# Patient Record
Sex: Male | Born: 1985 | Hispanic: Yes | Marital: Single
Health system: Southern US, Community
[De-identification: ages and names within clinical notes are randomized; demographics above are authoritative.]

## PROBLEM LIST (undated history)

## (undated) DIAGNOSIS — E119 Type 2 diabetes mellitus without complications: Secondary | ICD-10-CM

---

## 2006-06-08 ENCOUNTER — Emergency Department: Payer: Self-pay | Admitting: Emergency Medicine

## 2006-06-09 ENCOUNTER — Emergency Department: Payer: Self-pay | Admitting: Emergency Medicine

## 2007-01-08 ENCOUNTER — Emergency Department: Payer: Self-pay | Admitting: Emergency Medicine

## 2007-02-16 ENCOUNTER — Emergency Department: Payer: Self-pay | Admitting: Emergency Medicine

## 2007-05-29 ENCOUNTER — Emergency Department: Payer: Self-pay | Admitting: Emergency Medicine

## 2007-05-30 ENCOUNTER — Emergency Department: Payer: Self-pay | Admitting: Emergency Medicine

## 2008-03-24 ENCOUNTER — Emergency Department: Payer: Self-pay | Admitting: Emergency Medicine
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
## 2009-10-10 ENCOUNTER — Emergency Department: Payer: Self-pay | Admitting: Emergency Medicine

## 2018-04-21 ENCOUNTER — Emergency Department: Payer: Self-pay

## 2018-04-21 ENCOUNTER — Encounter: Payer: Self-pay | Admitting: Emergency Medicine

## 2018-04-21 ENCOUNTER — Emergency Department
Admission: EM | Admit: 2018-04-21 | Discharge: 2018-04-21 | Disposition: A | Discharge status: Home / Self Care | Payer: Self-pay | Attending: Emergency Medicine | Admitting: Emergency Medicine

## 2018-04-21 DIAGNOSIS — Y929 Unspecified place or not applicable: Secondary | ICD-10-CM | POA: Insufficient documentation

## 2018-04-21 DIAGNOSIS — R0789 Other chest pain: Secondary | ICD-10-CM | POA: Insufficient documentation

## 2018-04-21 DIAGNOSIS — Z008 Encounter for other general examination: Secondary | ICD-10-CM | POA: Insufficient documentation

## 2018-04-21 DIAGNOSIS — Y9389 Activity, other specified: Secondary | ICD-10-CM | POA: Insufficient documentation

## 2018-04-21 DIAGNOSIS — R0602 Shortness of breath: Secondary | ICD-10-CM | POA: Insufficient documentation

## 2018-04-21 DIAGNOSIS — E119 Type 2 diabetes mellitus without complications: Secondary | ICD-10-CM | POA: Insufficient documentation

## 2018-04-21 DIAGNOSIS — Y999 Unspecified external cause status: Secondary | ICD-10-CM | POA: Insufficient documentation

## 2018-04-21 HISTORY — DX: Type 2 diabetes mellitus without complications: E11.9

## 2018-04-21 LAB — GLUCOSE, CAPILLARY: GLUCOSE-CAPILLARY: 130 mg/dL — AB (ref 70–99)

## 2018-04-21 MED ORDER — AMMONIA AROMATIC IN INHA
RESPIRATORY_TRACT | Status: AC
  Filled 2018-04-21: qty 10
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
## 2018-04-21 NOTE — Discharge Instructions (Signed)
Your chest x-ray was reassuring with no signs of acute abnormality nor injury.  Please follow-up with a doctor when you have the opportunity.

## 2018-04-21 NOTE — ED Notes (Signed)
Pt resting comfortably with bed locked in lowest position.
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
## 2023-08-28 ENCOUNTER — Emergency Department (HOSPITAL_COMMUNITY)
Admission: EM | Admit: 2023-08-28 | Discharge: 2023-08-28 | Attending: Emergency Medicine | Admitting: Emergency Medicine

## 2023-08-28 ENCOUNTER — Emergency Department (HOSPITAL_COMMUNITY)

## 2023-08-28 ENCOUNTER — Other Ambulatory Visit: Payer: Self-pay

## 2023-08-28 DIAGNOSIS — R202 Paresthesia of skin: Secondary | ICD-10-CM | POA: Diagnosis not present

## 2023-08-28 DIAGNOSIS — R079 Chest pain, unspecified: Secondary | ICD-10-CM | POA: Diagnosis not present

## 2023-08-28 DIAGNOSIS — R0789 Other chest pain: Secondary | ICD-10-CM | POA: Insufficient documentation

## 2023-08-28 DIAGNOSIS — R000 Tachycardia, unspecified: Secondary | ICD-10-CM | POA: Diagnosis not present

## 2023-08-28 LAB — CBC WITH DIFFERENTIAL/PLATELET
Abs Immature Granulocytes: 0.04 10*3/uL (ref 0.00–0.07)
Basophils Absolute: 0.1 10*3/uL (ref 0.0–0.1)
Basophils Relative: 1 %
Eosinophils Absolute: 0 10*3/uL (ref 0.0–0.5)
Eosinophils Relative: 0 %
HCT: 41.2 % (ref 39.0–52.0)
Hemoglobin: 14.1 g/dL (ref 13.0–17.0)
Immature Granulocytes: 0 %
Lymphocytes Relative: 16 %
Lymphs Abs: 1.8 10*3/uL (ref 0.7–4.0)
MCH: 31.1 pg (ref 26.0–34.0)
MCHC: 34.2 g/dL (ref 30.0–36.0)
MCV: 90.7 fL (ref 80.0–100.0)
Monocytes Absolute: 0.9 10*3/uL (ref 0.1–1.0)
Monocytes Relative: 8 %
Neutro Abs: 8.6 10*3/uL — ABNORMAL HIGH (ref 1.7–7.7)
Neutrophils Relative %: 75 %
Platelets: 285 10*3/uL (ref 150–400)
RBC: 4.54 MIL/uL (ref 4.22–5.81)
RDW: 12.9 % (ref 11.5–15.5)
WBC: 11.5 10*3/uL — ABNORMAL HIGH (ref 4.0–10.5)
nRBC: 0 % (ref 0.0–0.2)

## 2023-08-28 LAB — BASIC METABOLIC PANEL
Anion gap: 12 (ref 5–15)
BUN: 16 mg/dL (ref 6–20)
CO2: 25 mmol/L (ref 22–32)
Calcium: 9.5 mg/dL (ref 8.9–10.3)
Chloride: 102 mmol/L (ref 98–111)
Creatinine, Ser: 0.81 mg/dL (ref 0.61–1.24)
GFR, Estimated: >60 mL/min (ref >60)
Glucose, Bld: 120 mg/dL — ABNORMAL HIGH (ref 70–99)
Potassium: 4.3 mmol/L (ref 3.5–5.1)
Sodium: 139 mmol/L (ref 135–145)

## 2023-08-28 LAB — TROPONIN I (HIGH SENSITIVITY)
Troponin I (High Sensitivity): 15 ng/L (ref <18)
Troponin I (High Sensitivity): 21 ng/L — ABNORMAL HIGH (ref <18)
Troponin I (High Sensitivity): 18 ng/L — ABNORMAL HIGH (ref <18)

## 2023-08-28 LAB — CBG MONITORING, ED: Glucose-Capillary: 115 mg/dL — ABNORMAL HIGH (ref 70–99)

## 2023-08-28 LAB — D-DIMER, QUANTITATIVE: D-Dimer, Quant: 0.28 ug{FEU}/mL (ref 0.00–0.50)

## 2023-08-28 MED ORDER — ALUM & MAG HYDROXIDE-SIMETH 200-200-20 MG/5ML PO SUSP
30.0000 mL | Freq: Once | ORAL | Status: AC
  Administered 2023-08-28: 30 mL via ORAL
  Filled 2023-08-28: qty 30

## 2023-08-28 MED ORDER — SUCRALFATE 1 G PO TABS: 1.0000 g | ORAL_TABLET | Freq: Three times a day (TID) | ORAL | 0 refills | Status: AC

## 2023-08-28 NOTE — ED Provider Notes (Signed)
 [place] EMERGENCY DEPARTMENT AT [facility] Provider Note   CSN: [id number] Arrival date & time: 08/28/23  9562     History  Chief Complaint  Patient presents with   Chest Pain    Justin Harvey is a 38 y.o. male.  HPI    38 year old male comes in with chief complaint of chest pain.  Patient currently incarcerated and attended by police officer.  He indicates that he has no past medical history.  Over the last 9 months, patient has had about 5-6 episodes of chest pain including 1 today.  The last episode was 2 months ago.  Today the chest pain started around breakfast time.  Patient started experiencing pressure, squeezing type pain over the left side of his chest with some discomfort over the left arm.  Patient took multiple Tums, but there was no relief in his symptoms.  He went to the medical camp at the prison, they gave him nitro and his pain started resolving.  Patient states that the pain started as intermittent, squeezing type pain that was coming and going -but then it became constant.  He did not have any associated shortness of breath, nausea, sweating.  Patient denies any smoking or stimulant use.  There is no history of PE, DVT.  No family history of premature CAD.  In the past, the chest pain would resolve about an hour.  He has taken GI cocktail or Tums to alleviate the symptoms.  This time the Tums did not help at all.  Of note, he also reports that over the last 9 months, he will have weekly episode of left-sided facial numbness.  Though symptoms start with burning and tingling sensation to his left lip, thereafter the discomfort moves in his mouth, and he starts feeling pressure like someone is pushing over the left side of his face.  The episode would last for about an hour.  He has no history of seizures.  No history of migraines, trigeminal neuralgia diagnosis.  Home Medications Prior to Admission medications   Medication Sig Start Date End Date Taking?  Authorizing Provider  sucralfate (CARAFATE) 1 g tablet Take 1 tablet (1 g total) by mouth 4 (four) times daily -  with meals and at bedtime. 08/28/23  Yes Derwood Kaplan, MD      Allergies    Patient has no known allergies.    Review of Systems   Review of Systems  All other systems reviewed and are negative.   Physical Exam Updated Vital Signs BP (!) 142/97 (BP Location: Left Arm)   Pulse 100   Temp 99 F (37.2 C) (Oral)   Resp (!) 30   Ht 5\' 7"  (1.702 m)   Wt 87.5 kg   SpO2 96%   BMI 30.23 kg/m  Physical Exam Vitals and nursing note reviewed.  Constitutional:      Appearance: He is well-developed.  HENT:     Head: Atraumatic.  Cardiovascular:     Rate and Rhythm: Normal rate.  Pulmonary:     Effort: Pulmonary effort is normal.  Musculoskeletal:     Cervical back: Neck supple.  Skin:    General: Skin is warm.  Neurological:     Mental Status: He is alert and oriented to person, place, and time.     ED Results / Procedures / Treatments   Labs (all labs ordered are listed, but only abnormal results are displayed) Labs Reviewed  BASIC METABOLIC PANEL - Abnormal; Notable for the following  components:      Result Value   Glucose, Bld 120 (*)    All other components within normal limits  CBC WITH DIFFERENTIAL/PLATELET - Abnormal; Notable for the following components:   WBC 11.5 (*)    Neutro Abs 8.6 (*)    All other components within normal limits  CBG MONITORING, ED - Abnormal; Notable for the following components:   Glucose-Capillary 115 (*)    All other components within normal limits  TROPONIN I (HIGH SENSITIVITY) - Abnormal; Notable for the following components:   Troponin I (High Sensitivity) 21 (*)    All other components within normal limits  TROPONIN I (HIGH SENSITIVITY) - Abnormal; Notable for the following components:   Troponin I (High Sensitivity) 18 (*)    All other components within normal limits  D-DIMER, QUANTITATIVE  TROPONIN I (HIGH  SENSITIVITY)    EKG EKG Interpretation Date/Time:  Wednesday August 28 2023 08:11:54 EDT Ventricular Rate:  101 PR Interval:  168 QRS Duration:  92 QT Interval:  335 QTC Calculation: 435 R Axis:   89  Text Interpretation: Sinus tachycardia [id number] noted No acute changes No old tracing to compare Confirmed by Derwood Kaplan [phone]) on 08/28/2023 9:11:04 AM  Radiology CT Head Wo Contrast Result Date: 08/28/2023 CLINICAL DATA:  Left side numbness. EXAM: CT HEAD WITHOUT CONTRAST TECHNIQUE: Contiguous axial images were obtained from the base of the skull through the vertex without intravenous contrast. RADIATION DOSE REDUCTION: This exam was performed according to the departmental dose-optimization program which includes automated exposure control, adjustment of the mA and/or kV according to patient size and/or use of iterative reconstruction technique. COMPARISON:  None Available. FINDINGS: Brain: No acute intracranial abnormality. Specifically, no hemorrhage, hydrocephalus, mass lesion, acute infarction, or significant intracranial injury. Vascular: No hyperdense vessel or unexpected calcification. Skull: No acute calvarial abnormality. Sinuses/Orbits: No acute findings Other: None IMPRESSION: Normal study. Electronically Signed   By: Charlett Nose M.D.   On: 08/28/2023 10:41    Procedures Procedures    Medications Ordered in ED Medications  alum & mag hydroxide-simeth (MAALOX/MYLANTA) 200-200-20 MG/5ML suspension 30 mL (30 mLs Oral Given 08/28/23 1914)    ED Course/ Medical Decision Making/ A&P Clinical Course as of 08/28/23 1440  Wed Aug 28, 2023  1138 Troponin I (High Sensitivity)(!): 21 Repeat troponin is 21.  Barely above the critical value of 19, and the delta is right at 5. My pre-test probability remains non-cardiac cause, but given the rise in delta at 5, we will repeat yet another trop in 2 hours. [AN]  1440 Troponin I (High Sensitivity)(!) Dropped down to 18.  Within the delta  of 5.  Stable for discharge.  Results discussed. [AN]    Clinical Course User Index [AN] Derwood Kaplan, MD             HEART Score: 1                    Medical Decision Making Amount and/or Complexity of Data Reviewed Labs: ordered. Decision-making details documented in ED Course. Radiology: ordered.  Risk OTC drugs. Prescription drug management.  This patient presents to the ED with chief complaint(s) of chest pain with NO pertinent past medical history, and he also reports intermittent episodes of left-sided facial discomfort.The complaint involves an extensive differential diagnosis and also carries with it a high risk of complications and morbidity.    The differential diagnosis includes : Acute coronary syndrome, peptic ulcer disease, esophagitis, gastritis, esophageal spasm, coronary  vasospasm, trigeminal neuralgia, neuropraxia.  Low suspicion for stroke, tumor.  The initial plan is to get basic labs including D-dimer.  Patient does have S1Q3T3 pattern on EKG and is tachycardic, but he does not have any pleuritic component to the chest pain.  Low risk for DVT/PE and low HEAR score.  Nitroglycerin did help alleviate the symptoms, but very low suspicion for ACS.   Independent labs interpretation:  The following labs were independently interpreted: trop and dimer are reassuring.   Independent visualization and interpretation of imaging: - I independently visualized the following imaging with scope of interpretation limited to determining acute life threatening conditions related to emergency care: CT scan of the brain, which revealed no evidence of bleed, mass effect.  Treatment and Reassessment: Results of the ED workup discussed with the patient. The patient informs me on reassessment, that GI cocktail alleviated the left lower symptoms.  He takes Zantac daily.  I still suspect this is esophageal spasm/esophagitis most likely in less likely to be ACS.    I am not sure  what to make of his left-sided facial symptoms that are intermittently present once every week or 2.  His oral exam is also normal.  Advised that he keep a journal of his symptoms to see if it evolves over time.  He asked me if he had Bell's palsy, which I categorically told him he does not.   Social Determinants of health: Incarcerated    Final Clinical Impression(s) / ED Diagnoses Final diagnoses:  Atypical chest pain  Facial tingling sensation    Rx / DC Orders ED Discharge Orders          Ordered    sucralfate (CARAFATE) 1 g tablet  3 times daily with meals & bedtime        08/28/23 1425              Derwood Kaplan, MD 08/28/23 1440

## 2023-08-28 NOTE — Discharge Instructions (Addendum)
 We saw you in the ER for the chest pain/shortness of breath. All of our cardiac workup is normal, including labs.  CT scan of your brain is also normal.  We are not sure what is causing your discomfort, but it is unlikely due to heart.  It is possible that you might be having gastritis.  Consider taking the medication that is prescribed for the next 4 weeks to see if it prevents repeat episodes.  If it does help, discussed with your medical provider at the present to see if they need you to be on omeprazole instead of Zantac.

## 2023-08-28 NOTE — ED Triage Notes (Addendum)
 Patient brought from [facility] correction[facility] by [facility] for chest pain. Pt has a history of acid reflux. Patient was given 1 sublingual nitroglycerin pill by nurse at correctional facility and 1 baby aspirin by EMS. Patient complains of having left sided numbness on his face periodically.
